# Patient Record
Sex: Female | Born: 1990 | Race: Black or African American | Hispanic: No | Marital: Single | Smoking: Never smoker
Health system: Southern US, Community
[De-identification: ages and names within clinical notes are randomized; demographics above are authoritative.]

## PROBLEM LIST (undated history)

## (undated) DIAGNOSIS — N760 Acute vaginitis: Secondary | ICD-10-CM

## (undated) DIAGNOSIS — B9689 Other specified bacterial agents as the cause of diseases classified elsewhere: Secondary | ICD-10-CM

---

## 2009-09-28 ENCOUNTER — Ambulatory Visit (HOSPITAL_COMMUNITY): Admission: RE | Admit: 2009-09-28 | Discharge: 2009-09-28 | Payer: Self-pay | Admitting: Obstetrics

## 2010-01-01 ENCOUNTER — Emergency Department (HOSPITAL_COMMUNITY): Admission: EM | Admit: 2010-01-01 | Discharge: 2010-01-02 | Payer: Self-pay | Admitting: Emergency Medicine

## 2010-02-16 ENCOUNTER — Encounter: Payer: Self-pay | Admitting: Obstetrics

## 2010-02-16 ENCOUNTER — Inpatient Hospital Stay (HOSPITAL_COMMUNITY): Admission: AD | Admit: 2010-02-16 | Discharge: 2010-02-19 | Payer: Self-pay | Admitting: Obstetrics

## 2010-06-20 LAB — CBC
HCT: 25.1 % — ABNORMAL LOW (ref 36.0–46.0)
Hemoglobin: 7.5 g/dL — ABNORMAL LOW (ref 12.0–15.0)
Hemoglobin: 8 g/dL — ABNORMAL LOW (ref 12.0–15.0)
MCH: 18.1 pg — ABNORMAL LOW (ref 26.0–34.0)
MCHC: 29.8 g/dL — ABNORMAL LOW (ref 30.0–36.0)
MCV: 60.3 fL — ABNORMAL LOW (ref 78.0–100.0)
Platelets: 235 10*3/uL (ref 150–400)
RBC: 4.43 MIL/uL (ref 3.87–5.11)
RDW: 21.1 % — ABNORMAL HIGH (ref 11.5–15.5)
WBC: 18.2 10*3/uL — ABNORMAL HIGH (ref 4.0–10.5)

## 2010-06-20 LAB — RPR: RPR Ser Ql: NONREACTIVE

## 2015-06-03 ENCOUNTER — Telehealth: Payer: Self-pay

## 2015-06-03 NOTE — Telephone Encounter (Signed)
Will check in [facility] system for medical records

## 2015-06-07 ENCOUNTER — Telehealth: Payer: Self-pay

## 2015-06-07 NOTE — Telephone Encounter (Signed)
Faxed medical records from [facility] to [facility] in [place] to [phone]

## 2020-10-30 ENCOUNTER — Emergency Department (HOSPITAL_BASED_OUTPATIENT_CLINIC_OR_DEPARTMENT_OTHER)
Admission: EM | Admit: 2020-10-30 | Discharge: 2020-10-30 | Disposition: A | Discharge status: Home / Self Care | Payer: Medicaid Other | Attending: Emergency Medicine | Admitting: Emergency Medicine

## 2020-10-30 ENCOUNTER — Encounter (HOSPITAL_BASED_OUTPATIENT_CLINIC_OR_DEPARTMENT_OTHER): Payer: Self-pay | Admitting: *Deleted

## 2020-10-30 ENCOUNTER — Other Ambulatory Visit: Payer: Self-pay

## 2020-10-30 DIAGNOSIS — J069 Acute upper respiratory infection, unspecified: Secondary | ICD-10-CM

## 2020-10-30 DIAGNOSIS — U071 COVID-19: Secondary | ICD-10-CM | POA: Insufficient documentation

## 2020-10-30 LAB — RESP PANEL BY RT-PCR (FLU A&B, COVID) ARPGX2
Influenza A by PCR: NEGATIVE
Influenza B by PCR: NEGATIVE
SARS Coronavirus 2 by RT PCR: POSITIVE — AB

## 2020-10-30 NOTE — ED Triage Notes (Signed)
States Friday PM had a fever, states took OTC meds, fever did go down. Does work at a preschool. Did rec both Covid Vaccines but no booster

## 2020-10-30 NOTE — Discharge Instructions (Signed)
You have been tested for COVID-19 today as well as a flu.  You may follow your test results in the MyChart app.  Use over-the-counter medication as needed.if your test negative for COVID-19, you may return to work once you are fever free for 24 hours without Tylenol and ibuprofen.  If you test positive you may not return to work until Thursday 7/28.  Return to the ER for develop any new chest pain, difficulty breathing, nausea or vomiting does not stop, or any new severe symptoms.

## 2020-10-30 NOTE — ED Provider Notes (Signed)
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  T-max 102 F on Saturday.  No fever today, fever managed with OTC medications.  Vaccinated x2 against COVID, has not been boosted.  Wants testing for work as she works around children.  I personally reviewed the patient's medical records.  She does not carry medical diagnoses and is not any medications every day.  HPI     History reviewed. No pertinent past medical history.  There are no problems to display for this patient.   History reviewed. No pertinent surgical history.   OB History   No obstetric history on file.     History reviewed. No pertinent family history.     Home Medications Prior to Admission medications   Not on File    Allergies    Patient has no known allergies.  Review of Systems   Review of Systems  Constitutional:  Positive for chills, fatigue and fever. Negative for activity change, appetite change and diaphoresis.  HENT:  Positive for congestion and sore throat. Negative for tinnitus, trouble swallowing and voice change.   Eyes: Negative.   Respiratory:  Positive for cough. Negative for apnea, choking, chest tightness and shortness of breath.   Cardiovascular: Negative.   Gastrointestinal: Negative.   Genitourinary: Negative.   Musculoskeletal: Negative.   Neurological: Negative.    Physical Exam Updated Vital Signs BP 112/80 (BP Location: Right Arm)   Pulse 98   Temp 98.4 F (36.9 C) (Oral)   Resp 16   Ht 5\' 6"  (1.676 m)   Wt 77.1 kg   BMI 27.44 kg/m   Physical Exam Vitals and nursing note reviewed.  Constitutional:      Appearance: She is normal weight. She is not ill-appearing or  toxic-appearing.  HENT:     Head: Normocephalic and atraumatic.     Nose: Congestion present.     Right Nostril: No septal hematoma.     Left Nostril: No septal hematoma.     Mouth/Throat:     Mouth: Mucous membranes are moist.     Pharynx: Oropharynx is clear. Uvula midline. Posterior oropharyngeal erythema present. No pharyngeal swelling, oropharyngeal exudate or uvula swelling.     Tonsils: No tonsillar exudate.  Eyes:     General: Lids are normal. Vision grossly intact.        Right eye: No discharge.        Left eye: No discharge.     Extraocular Movements: Extraocular movements intact.     Conjunctiva/sclera: Conjunctivae normal.     Pupils: Pupils are equal, round, and reactive to light.     Comments: Colored contacts in place  Neck:     Trachea: Trachea and phonation normal.     Meningeal: Brudzinski's sign and Kernig's sign absent.  Cardiovascular:     Rate and Rhythm: Normal rate and regular rhythm.     Pulses: Normal pulses.     Heart sounds: Normal heart sounds. No murmur heard. Pulmonary:     Effort: Pulmonary effort is normal. No tachypnea, bradypnea, accessory muscle usage, prolonged expiration or respiratory distress.     Breath sounds: Normal breath sounds. No decreased breath sounds, wheezing  or rales.  Chest:     Chest wall: No mass, lacerations, deformity, swelling, tenderness, crepitus or edema.  Abdominal:     General: Bowel sounds are normal. There is no distension.     Palpations: Abdomen is soft.     Tenderness: There is no abdominal tenderness. There is no right CVA tenderness, left CVA tenderness, guarding or rebound.  Musculoskeletal:        General: No deformity.     Cervical back: Normal range of motion and neck supple. No edema, rigidity or crepitus. No pain with movement, spinous process tenderness or muscular tenderness.     Right lower leg: No edema.     Left lower leg: No edema.  Lymphadenopathy:     Cervical: No cervical adenopathy.   Skin:    General: Skin is warm and dry.     Capillary Refill: Capillary refill takes less than 2 seconds.     Findings: No rash.  Neurological:     General: No focal deficit present.     Mental Status: She is alert and oriented to person, place, and time. Mental status is at baseline.  Psychiatric:        Mood and Affect: Mood normal.    ED Results / Procedures / Treatments   Labs (all labs ordered are listed, but only abnormal results are displayed) Labs Reviewed  RESP PANEL BY RT-PCR (FLU A&B, COVID) [id number]    EKG None  Radiology No results found.  Procedures Procedures   Medications Ordered in ED Medications - No data to display  ED Course  I have reviewed the triage vital signs and the nursing notes.  Pertinent labs & imaging results that were available during my care of the patient were reviewed by me and considered in my medical decision making (see chart for details).    MDM Rules/Calculators/A&P                         30 year old female with 3 days of fever, sore throat, chills, nonproductive cough.  Vaccinated historians COVID, works in a daycare.  Differential diagnosis includes is limited to COVID-19, nausea/B, other acute viral upper respiratory infection, pneumonia..  Vital signs are normal intake.  Cardiopulmonary exam is normal, abdominal exam is benign.  HEENT exam did some bilateral nasal congestion without rhinorrhea, mild posterior pharyngeal erythema without exudate or tonsillar edema.  No lymphadenopathy.   Patient is well-appearing.  Will test for COVID-19 and influenza A/B and allow her to be discharged home with outpatient follow-up of her test results on MyChart app.  No further work-up warranted in ED this time given reassuring physical exam and vital signs.  Kathy Schwartz voiced understanding of her medical evaluation and treatment plan.  Each of her questions was answered to her expressed satisfaction.  Return precautions given.  Patient is  well-appearing, stable, appropriate for discharge at this time.  This chart was dictated using voice recognition software, Dragon. Despite the best efforts of this provider to proofread and correct errors, errors may still occur which can change documentation meaning.  LYNDSAY TALAMANTE was evaluated in Emergency Department on 10/30/2020 for the symptoms described in the history of present illness. She was evaluated in the context of the global COVID-19 pandemic, which necessitated consideration that the patient might be at risk for infection with the SARS-CoV-2 virus that causes COVID-19. Institutional protocols and algorithms that pertain to the evaluation of patients at risk for COVID-19 are in a  state of rapid change based on information released by regulatory bodies including the CDC and federal and state organizations. These policies and algorithms were followed during the patient's care in the ED.  Final Clinical Impression(s) / ED Diagnoses Final diagnoses:  None    Rx / DC Orders ED Discharge Orders     None        Sherrilee Gilles 10/30/20 1106    Terrilee Files, MD 10/31/20 1149

## 2021-02-27 ENCOUNTER — Encounter (HOSPITAL_BASED_OUTPATIENT_CLINIC_OR_DEPARTMENT_OTHER): Payer: Self-pay

## 2021-02-27 ENCOUNTER — Other Ambulatory Visit: Payer: Self-pay

## 2021-02-27 ENCOUNTER — Emergency Department (HOSPITAL_BASED_OUTPATIENT_CLINIC_OR_DEPARTMENT_OTHER)
Admission: EM | Admit: 2021-02-27 | Discharge: 2021-02-27 | Disposition: A | Discharge status: Home / Self Care | Payer: Medicaid Other | Attending: Student | Admitting: Student

## 2021-02-27 DIAGNOSIS — N764 Abscess of vulva: Secondary | ICD-10-CM | POA: Insufficient documentation

## 2021-02-27 LAB — PREGNANCY, URINE: Preg Test, Ur: NEGATIVE

## 2021-02-27 MED ORDER — LIDOCAINE HCL (PF) 1 % IJ SOLN
5.0000 mL | Freq: Once | INTRAMUSCULAR | Status: AC
  Administered 2021-02-27: 5 mL via INTRADERMAL
  Filled 2021-02-27: qty 5

## 2021-02-27 MED ORDER — CEFADROXIL 500 MG PO CAPS: 500.0000 mg | ORAL_CAPSULE | Freq: Two times a day (BID) | ORAL | 0 refills | Status: AC

## 2021-02-27 MED ORDER — CEPHALEXIN 250 MG PO CAPS
500.0000 mg | ORAL_CAPSULE | Freq: Once | ORAL | Status: AC
  Administered 2021-02-27: 500 mg via ORAL
  Filled 2021-02-27: qty 2

## 2021-02-27 NOTE — ED Provider Notes (Signed)
[facility] EMERGENCY DEPARTMENT Provider Note   CSN: [id number] Arrival date & time: 02/27/21  1455     History Chief Complaint  Patient presents with   Vaginal Pain    Kathy Schwartz is a 30 y.o. female who presents emergency department for evaluation of vaginal pain and swelling.  Patient states that since she was 30 years old she has had a small swelling in the labia majora on the right but it has not grown in size or been painful for over 20 years.  She states that over the last 1 week this has grown significantly in size and is associated with persistent pain.  She denies vaginal discharge or bleeding.  Denies nausea, vomiting, headache, cough, fever or other systemic symptoms.   Vaginal Pain Pertinent negatives include no chest pain, no abdominal pain and no shortness of breath.      History reviewed. No pertinent past medical history.  There are no problems to display for this patient.   Past Surgical History:  Procedure Laterality Date   CESAREAN SECTION       OB History   No obstetric history on file.     History reviewed. No pertinent family history.  Social History   Tobacco Use   Smoking status: Never   Smokeless tobacco: Never  Vaping Use   Vaping Use: Never used  Substance Use Topics   Alcohol use: Never   Drug use: Never    Home Medications Prior to Admission medications   Not on File    Allergies    Patient has no known allergies.  Review of Systems   Review of Systems  Constitutional:  Negative for chills and fever.  HENT:  Negative for ear pain and sore throat.   Eyes:  Negative for pain and visual disturbance.  Respiratory:  Negative for cough and shortness of breath.   Cardiovascular:  Negative for chest pain and palpitations.  Gastrointestinal:  Negative for abdominal pain and vomiting.  Genitourinary:  Positive for vaginal pain. Negative for dysuria and hematuria.  Musculoskeletal:  Negative for arthralgias and  back pain.  Skin:  Negative for color change and rash.  Neurological:  Negative for seizures and syncope.  All other systems reviewed and are negative.  Physical Exam Updated Vital Signs BP 112/73 (BP Location: Left Arm)   Pulse 76   Temp 98.8 F (37.1 C) (Oral)   Resp 18   Ht 5\' 6"  (1.676 m)   Wt 74.8 kg   LMP 02/07/2021 (Approximate)   SpO2 99%   BMI 26.63 kg/m   Physical Exam Vitals and nursing note reviewed.  Constitutional:      General: She is not in acute distress.    Appearance: She is well-developed.  HENT:     Head: Normocephalic and atraumatic.  Eyes:     Conjunctiva/sclera: Conjunctivae normal.  Cardiovascular:     Rate and Rhythm: Normal rate and regular rhythm.     Heart sounds: No murmur heard. Pulmonary:     Effort: Pulmonary effort is normal. No respiratory distress.     Breath sounds: Normal breath sounds.  Abdominal:     Palpations: Abdomen is soft.     Tenderness: There is no abdominal tenderness.  Genitourinary:    Comments: To centimeter area of swelling and induration over the right labia majora Musculoskeletal:        General: No swelling.     Cervical back: Neck supple.  Skin:    General: Skin  is warm and dry.     Capillary Refill: Capillary refill takes less than 2 seconds.  Neurological:     Mental Status: She is alert.  Psychiatric:        Mood and Affect: Mood normal.    ED Results / Procedures / Treatments   Labs (all labs ordered are listed, but only abnormal results are displayed) Labs Reviewed  PREGNANCY, URINE    EKG None  Radiology No results found.  Procedures .Marland KitchenIncision and Drainage  Date/Time: 02/27/2021 11:03 PM Performed by: Glendora Score, MD Authorized by: Glendora Score, MD   Location:    Type:  Abscess   Size:  2   Location: R labia majora. Pre-procedure details:    Skin preparation:  Povidone-iodine Sedation:    Sedation type:  None Anesthesia:    Anesthesia method:  Local infiltration    Local anesthetic:  Lidocaine 1% w/o epi Procedure type:    Complexity:  Simple Procedure details:    Incision types:  Stab incision   Wound management:  Probed and deloculated   Drainage:  Purulent and bloody   Drainage amount:  Moderate   Wound treatment:  Wound left open Post-procedure details:    Procedure completion:  Tolerated well, no immediate complications   Medications Ordered in ED Medications  lidocaine (PF) (XYLOCAINE) 1 % injection 5 mL (has no administration in time range)    ED Course  I have reviewed the triage vital signs and the nursing notes.  Pertinent labs & imaging results that were available during my care of the patient were reviewed by me and considered in my medical decision making (see chart for details).    MDM Rules/Calculators/A&P                           Patient seen emergency department for evaluation of an abscess.  Physical exam reveals a 2 cm area of tender induration over the right labia majora.  There is no evidence of Bartholin cyst.  I&D performed at bedside with drainage of moderate purulent and bloody fluid.  Wound left open and dressed.  She was then discharged on [facility] with OB/GYN follow-up. Final Clinical Impression(s) / ED Diagnoses Final diagnoses:  None    Rx / DC Orders ED Discharge Orders     None        Dheeraj Hail, Wyn Forster, MD 02/27/21 [phone]

## 2021-02-27 NOTE — ED Triage Notes (Signed)
Pt states she has a "knot on her vagina". States has had it since she was 8, but has been increasing in size and painful the past week. Denies discharge.

## 2021-02-27 NOTE — ED Notes (Signed)
D/c paperwork reviewed with pt, including prescription. Pt verbalized understanding, ambulatory to ED exit.

## 2023-04-02 ENCOUNTER — Emergency Department (HOSPITAL_BASED_OUTPATIENT_CLINIC_OR_DEPARTMENT_OTHER)
Admission: EM | Admit: 2023-04-02 | Discharge: 2023-04-02 | Disposition: A | Discharge status: Home / Self Care | Payer: Self-pay | Attending: Emergency Medicine | Admitting: Emergency Medicine

## 2023-04-02 ENCOUNTER — Encounter (HOSPITAL_BASED_OUTPATIENT_CLINIC_OR_DEPARTMENT_OTHER): Payer: Self-pay | Admitting: Emergency Medicine

## 2023-04-02 ENCOUNTER — Other Ambulatory Visit: Payer: Self-pay

## 2023-04-02 DIAGNOSIS — N898 Other specified noninflammatory disorders of vagina: Secondary | ICD-10-CM | POA: Insufficient documentation

## 2023-04-02 LAB — WET PREP, GENITAL
Clue Cells Wet Prep HPF POC: NONE SEEN
Clue Cells Wet Prep HPF POC: NONE SEEN
Sperm: NONE SEEN
Sperm: NONE SEEN
Trich, Wet Prep: NONE SEEN
Trich, Wet Prep: NONE SEEN
WBC, Wet Prep HPF POC: <10 (ref <10)
WBC, Wet Prep HPF POC: <10 (ref <10)
Yeast Wet Prep HPF POC: NONE SEEN
Yeast Wet Prep HPF POC: NONE SEEN

## 2023-04-02 LAB — URINALYSIS, ROUTINE W REFLEX MICROSCOPIC
Bilirubin Urine: NEGATIVE
Glucose, UA: NEGATIVE mg/dL
Hgb urine dipstick: NEGATIVE
Ketones, ur: NEGATIVE mg/dL
Leukocytes,Ua: NEGATIVE
Nitrite: NEGATIVE
Protein, ur: NEGATIVE mg/dL
Specific Gravity, Urine: 1.02 (ref 1.005–1.030)
pH: 7 (ref 5.0–8.0)

## 2023-04-02 LAB — PREGNANCY, URINE: Preg Test, Ur: NEGATIVE

## 2023-04-02 MED ORDER — CLOTRIMAZOLE 1 % VA CREA: 1.0000 | TOPICAL_CREAM | Freq: Every day | VAGINAL | 0 refills | Status: AC

## 2023-04-02 NOTE — Discharge Instructions (Signed)
Today you are seen for vaginal irritation.  Please pick up your medication and take as prescribed.  You have 1 or more laboratory test pending and will receive a call if these tests require treatment.  Thank you for letting us treat you today. After performing a physical exam and reviewing your labs, I feel you are safe to go home. Please follow up with your PCP in the next several days and provide them with your records from this visit. Return to the Emergency Room if pain becomes severe or symptoms worsen.

## 2023-04-02 NOTE — ED Triage Notes (Signed)
Pt reports "heat" in vaginal area, pt reports being seen recently and taking Diflucan on Thursday, still feeling irritated, denies discharge, painful urination, or itchiness

## 2023-04-02 NOTE — ED Provider Notes (Signed)
[facility]ERGENCY DEPARTMENT AT [facility] Provider Note   CSN: [id number] Arrival date & time: 04/02/23  1951     History  Chief Complaint  Patient presents with   Vaginitis    Kathy Schwartz is a 32 y.o. female presents today for vaginal irritation that she describes as "heat".  Patient describes a history of frequent yeast infections.  She recently took a Diflucan on day 0, day 3, and day 7 which made the discharge she was experiencing stop but she is still having this "heat".  She denies discharge, itching, dysuria, hematuria, fever, pelvic pain, nausea, or vomiting.  Patient states that her last menstrual period began on 11/23.  Patient states that she does have unprotected intercourse therefore STI and pregnancy cannot be ruled out.  HPI     Home Medications Prior to Admission medications   Medication Sig Start Date End Date Taking? Authorizing Provider  clotrimazole (GYNE-LOTRIMIN) 1 % vaginal cream Place 1 Applicatorful vaginally at bedtime for 7 days. 04/02/23 04/09/23 Yes Dolphus Jenny, PA-C      Allergies    Patient has no known allergies.    Review of Systems   Review of Systems  Genitourinary:  Positive for vaginal pain.    Physical Exam Updated Vital Signs BP 118/89 (BP Location: Left Arm)   Pulse 83   Temp 97.8 F (36.6 C)   Resp 18   Ht 5\' 6"  (1.676 m)   Wt 79.4 kg   LMP 03/11/2023 (Approximate)   SpO2 96%   BMI 28.25 kg/m  Physical Exam Vitals and nursing note reviewed.  Constitutional:      General: She is not in acute distress.    Appearance: She is well-developed.  HENT:     Head: Normocephalic and atraumatic.     Right Ear: External ear normal.     Left Ear: External ear normal.     Nose: Nose normal.     Mouth/Throat:     Mouth: Mucous membranes are moist.  Eyes:     Conjunctiva/sclera: Conjunctivae normal.  Cardiovascular:     Rate and Rhythm: Normal rate and regular rhythm.     Pulses: Normal pulses.      Heart sounds: Normal heart sounds. No murmur heard. Pulmonary:     Effort: Pulmonary effort is normal. No respiratory distress.     Breath sounds: Normal breath sounds.  Abdominal:     Palpations: Abdomen is soft.     Tenderness: There is no abdominal tenderness.  Genitourinary:    Cervix: Discharge present. No friability, erythema or cervical bleeding.     Comments: Thick, chunky discharge noted most consistent with vaginal yeast Musculoskeletal:        General: No swelling. Normal range of motion.     Cervical back: Neck supple.  Skin:    General: Skin is warm and dry.     Capillary Refill: Capillary refill takes less than 2 seconds.  Neurological:     General: No focal deficit present.     Mental Status: She is alert.     Motor: No weakness.  Psychiatric:        Mood and Affect: Mood normal.     ED Results / Procedures / Treatments   Labs (all labs ordered are listed, but only abnormal results are displayed) Labs Reviewed  WET PREP, GENITAL  WET PREP, GENITAL  URINALYSIS, ROUTINE W REFLEX MICROSCOPIC  PREGNANCY, URINE  GC/CHLAMYDIA PROBE AMP ([place]) NOT AT [facility]  EKG None  Radiology No results found.  Procedures Procedures    Medications Ordered in ED Medications - No data to display  ED Course/ Medical Decision Making/ A&P                                 Medical Decision Making Amount and/or Complexity of Data Reviewed Labs: ordered.   This patient presents to the ED with chief complaint(s) of vaginal irritation with pertinent past medical history of none which further complicates the presenting complaint. The complaint involves an extensive differential diagnosis and also carries with it a high risk of complications and morbidity.    The differential diagnosis includes BV, yeast infection, gonorrhea, chlamydia, UTI  Additional history obtained: Records reviewed Care Everywhere/External Records  ED Course and Reassessment:   Independent  labs interpretation:  The following labs were independently interpreted:  Urine pregnancy: Negative UA: No notable findings Wet prep: Negative GC chlamydia: Pending  Consultation: - Consulted or discussed management/test interpretation w/ external professional: None  Consideration for admission or further workup: Considered for admission or further workup however patient's vital signs, physical exam, and labs are reassuring.  Patient's pelvic exam was most consistent with vaginal yeast infection and will give patient outpatient course of clotrimazole intravaginally for 1 week.  Patient should follow-up with PCP if symptoms persist for further evaluation and workup.         Final Clinical Impression(s) / ED Diagnoses Final diagnoses:  Vaginal irritation    Rx / DC Orders ED Discharge Orders          Ordered    clotrimazole (GYNE-LOTRIMIN) 1 % vaginal cream  Daily at bedtime        04/02/23 2143              Dolphus Jenny, PA-C 04/02/23 2145    Charlynne Pander, MD 04/02/23 2235

## 2023-04-04 LAB — GC/CHLAMYDIA PROBE AMP (~~LOC~~) NOT AT ARMC
Chlamydia: NEGATIVE
Comment: NEGATIVE
Comment: NORMAL
Neisseria Gonorrhea: NEGATIVE

## 2023-06-20 ENCOUNTER — Encounter (HOSPITAL_BASED_OUTPATIENT_CLINIC_OR_DEPARTMENT_OTHER): Payer: Self-pay | Admitting: Emergency Medicine

## 2023-06-20 ENCOUNTER — Emergency Department (HOSPITAL_BASED_OUTPATIENT_CLINIC_OR_DEPARTMENT_OTHER)
Admission: EM | Admit: 2023-06-20 | Discharge: 2023-06-20 | Disposition: A | Discharge status: Home / Self Care | Attending: Emergency Medicine | Admitting: Emergency Medicine

## 2023-06-20 ENCOUNTER — Other Ambulatory Visit: Payer: Self-pay

## 2023-06-20 DIAGNOSIS — A599 Trichomoniasis, unspecified: Secondary | ICD-10-CM | POA: Diagnosis not present

## 2023-06-20 DIAGNOSIS — Z202 Contact with and (suspected) exposure to infections with a predominantly sexual mode of transmission: Secondary | ICD-10-CM | POA: Diagnosis not present

## 2023-06-20 DIAGNOSIS — N912 Amenorrhea, unspecified: Secondary | ICD-10-CM | POA: Insufficient documentation

## 2023-06-20 DIAGNOSIS — R531 Weakness: Secondary | ICD-10-CM | POA: Diagnosis present

## 2023-06-20 HISTORY — DX: Other specified bacterial agents as the cause of diseases classified elsewhere: N76.0

## 2023-06-20 HISTORY — DX: Other specified bacterial agents as the cause of diseases classified elsewhere: B96.89

## 2023-06-20 LAB — HCG, QUANTITATIVE, PREGNANCY: hCG, Beta Chain, Quant, S: <1 m[IU]/mL (ref <5)

## 2023-06-20 LAB — URINALYSIS, ROUTINE W REFLEX MICROSCOPIC
Bilirubin Urine: NEGATIVE
Glucose, UA: NEGATIVE mg/dL
Hgb urine dipstick: NEGATIVE
Ketones, ur: NEGATIVE mg/dL
Nitrite: NEGATIVE
Protein, ur: NEGATIVE mg/dL
Specific Gravity, Urine: >=1.03 (ref 1.005–1.030)
pH: 6 (ref 5.0–8.0)

## 2023-06-20 LAB — URINALYSIS, MICROSCOPIC (REFLEX)

## 2023-06-20 LAB — PREGNANCY, URINE: Preg Test, Ur: NEGATIVE

## 2023-06-20 LAB — WET PREP, GENITAL
Clue Cells Wet Prep HPF POC: NONE SEEN
Sperm: NONE SEEN
WBC, Wet Prep HPF POC: >=10 — AB (ref <10)
Yeast Wet Prep HPF POC: NONE SEEN

## 2023-06-20 MED ORDER — METRONIDAZOLE 500 MG PO TABS: 500.0000 mg | ORAL_TABLET | Freq: Two times a day (BID) | ORAL | 0 refills | Status: AC

## 2023-06-20 MED ORDER — LIDOCAINE HCL (PF) 1 % IJ SOLN: 1.0000 mL | Freq: Once | INTRAMUSCULAR | Status: AC

## 2023-06-20 MED ORDER — LIDOCAINE HCL (PF) 1 % IJ SOLN
INTRAMUSCULAR | Status: AC
  Administered 2023-06-20: 1 mL
  Filled 2023-06-20: qty 5

## 2023-06-20 MED ORDER — DOXYCYCLINE HYCLATE 100 MG PO CAPS: 100.0000 mg | ORAL_CAPSULE | Freq: Two times a day (BID) | ORAL | 0 refills | Status: AC

## 2023-06-20 MED ORDER — DOXYCYCLINE HYCLATE 100 MG PO TABS
100.0000 mg | ORAL_TABLET | Freq: Once | ORAL | Status: AC
  Administered 2023-06-20: 100 mg via ORAL
  Filled 2023-06-20: qty 1

## 2023-06-20 MED ORDER — METRONIDAZOLE 500 MG PO TABS
500.0000 mg | ORAL_TABLET | Freq: Once | ORAL | Status: AC
  Administered 2023-06-20: 500 mg via ORAL
  Filled 2023-06-20: qty 1

## 2023-06-20 MED ORDER — CEFTRIAXONE SODIUM 500 MG IJ SOLR
500.0000 mg | Freq: Once | INTRAMUSCULAR | Status: AC
  Administered 2023-06-20: 500 mg via INTRAMUSCULAR
  Filled 2023-06-20: qty 500

## 2023-06-20 NOTE — ED Provider Notes (Signed)
 [place] [facility] Provider Note   CSN: [id number] Arrival date & time: 06/20/23  1629     History  Chief Complaint  Patient presents with   Vaginal Discharge    Kathy Schwartz is a 33 y.o. female with no pertinent past medical history presents to emergency department for evaluation of vaginal discharge and amenorrhea.  She endorses that her last menstrual period was 04/07/2023 and normal for her.  However, she has not had a period since then.  She is taken multiple home pregnancy test that were negative.  She is not currently on any types of birth control.  She also endorses white thick discharge that started last week.  She has had unprotected intercourse with other individuals and has concern for STD.  She denies abdominal pain, pelvic pain, vaginal odor, urinary symptoms, pain with intercourse, fevers.  She has an appointment with her OB/GYN on 07/12/2023   Vaginal Discharge Associated symptoms: no abdominal pain, no fever, no nausea and no vomiting       Home Medications Prior to Admission medications   Medication Sig Start Date End Date Taking? Authorizing Provider  doxycycline (VIBRAMYCIN) 100 MG capsule Take 1 capsule (100 mg total) by mouth 2 (two) times daily. 06/20/23  Yes Judithann Sheen, PA  metroNIDAZOLE (FLAGYL) 500 MG tablet Take 1 tablet (500 mg total) by mouth 2 (two) times daily. 06/20/23  Yes Judithann Sheen, PA      Allergies    Patient has no known allergies.    Review of Systems   Review of Systems  Constitutional:  Negative for chills, fatigue and fever.  Respiratory:  Negative for cough, chest tightness, shortness of breath and wheezing.   Cardiovascular:  Negative for chest pain and palpitations.  Gastrointestinal:  Negative for abdominal pain, constipation, diarrhea, nausea and vomiting.  Genitourinary:  Positive for vaginal discharge.  Neurological:  Negative for dizziness, seizures, weakness,  light-headedness, numbness and headaches.    Physical Exam Updated Vital Signs BP 123/84 (BP Location: Left Arm)   Pulse 78   Temp 98.4 F (36.9 C) (Oral)   Resp 14   Ht 5\' 6"  (1.676 m)   Wt 74.8 kg   LMP 04/07/2023 (Exact Date)   SpO2 99%   BMI 26.63 kg/m  Physical Exam Vitals and nursing note reviewed.  Constitutional:      General: She is not in acute distress.    Appearance: Normal appearance. She is not ill-appearing.  HENT:     Head: Normocephalic and atraumatic.  Eyes:     Conjunctiva/sclera: Conjunctivae normal.  Cardiovascular:     Rate and Rhythm: Normal rate.  Pulmonary:     Effort: Pulmonary effort is normal. No respiratory distress.     Breath sounds: Normal breath sounds.  Abdominal:     General: Bowel sounds are normal. There is no distension.     Palpations: Abdomen is soft.     Tenderness: There is no abdominal tenderness. There is no right CVA tenderness, left CVA tenderness, guarding or rebound.     Comments: No peritoneal signs.  Nonsurgical abdomen.  Heeltap negative.  Musculoskeletal:     Cervical back: Normal range of motion and neck supple. No rigidity.  Skin:    General: Skin is warm.     Capillary Refill: Capillary refill takes less than 2 seconds.     Coloration: Skin is not jaundiced or pale.  Neurological:     Mental Status: She is  alert and oriented to person, place, and time. Mental status is at baseline.     ED Results / Procedures / Treatments   Labs (all labs ordered are listed, but only abnormal results are displayed) Labs Reviewed  WET PREP, GENITAL - Abnormal; Notable for the following components:      Result Value   Trich, Wet Prep PRESENT (*)    WBC, Wet Prep HPF POC >=10 (*)    All other components within normal limits  URINALYSIS, ROUTINE W REFLEX MICROSCOPIC - Abnormal; Notable for the following components:   APPearance HAZY (*)    Leukocytes,Ua TRACE (*)    All other components within normal limits  URINALYSIS,  MICROSCOPIC (REFLEX) - Abnormal; Notable for the following components:   Bacteria, UA RARE (*)    Trichomonas, UA PRESENT (*)    All other components within normal limits  HCG, QUANTITATIVE, PREGNANCY  PREGNANCY, URINE  HIV ANTIBODY (ROUTINE TESTING W REFLEX)  GC/CHLAMYDIA PROBE AMP ([place]) NOT AT [facility]    EKG None  Radiology No results found.  Procedures Procedures    Medications Ordered in ED Medications  cefTRIAXone (ROCEPHIN) injection 500 mg (has no administration in time range)  doxycycline (VIBRA-TABS) tablet 100 mg (has no administration in time range)  metroNIDAZOLE (FLAGYL) tablet 500 mg (has no administration in time range)    ED Course/ Medical Decision Making/ A&P                                 Medical Decision Making Amount and/or Complexity of Data Reviewed Labs: ordered.  Risk Prescription drug management.   Patient presents to the ED for concern of amenorrhea, vaginal discharge, this involves an extensive number of treatment options, and is a complaint that carries with it a high risk of complications and morbidity.  The differential diagnosis includes pregnancy, STD, BV, yeast infection   Co morbidities that complicate the patient evaluation  Exposure with nonprotected sexual intercourse   Additional history obtained:  Additional history obtained from Nursing and Outside Medical Records   External records from outside source obtained and reviewed including triage RN note   Lab Tests:  I Ordered, and personally interpreted labs.  The pertinent results include:   GC chlamydia pending Urine contaminated but does not appear to be positive for UTI Trichomonas positive hCG negative    Medicines ordered and prescription drug management:  I ordered medication including Rocephin, Doxy, Metro for STD Reevaluation of the patient after these medicines showed that the patient improved I have reviewed the patients home medicines and have  made adjustments as needed    Problem List / ED Course:  Amenorrhea hCG quantitative less than 1 Denies forms of birth control Low suspicion for PID as patient denies pain with intercourse, no abd pain, VS WNL Shared decision making is had with patient and provider.  Patient does not wish to have pelvic exam at this time as she is following up with her GYN early April and will have them do it. Patient self swabbed. Will have patient follow-up with OB/GYN Exposure to STD Trichomonas infection Will treat for trichomonas Patient is interested in treating for GC and chlamydia she believes she has had an exposure as well as white thick vaginal discharge. GC chlamydia pending.  I discussed that she can follow-up on her MyChart regarding this results Wet prep significant for trichomonas.  No BV nor yeast Discussed importance of  taking entire antibiotic even if starting to feel better   Reevaluation:  After the interventions noted above, I reevaluated the patient and found that they have :stayed the same   Social Determinants of Health:  Is followed by [facility] at [employer]   Dispostion:  After consideration of the diagnostic results and the patients response to treatment, I feel that the patent would benefit from outpatient management with antibiotic course.   Discussed ED workup, disposition, return to ED precautions with patient who expresses understanding agrees with plan.  All questions answered to their satisfaction.  They are agreeable to plan.  Discharge instructions provided on paperwork  Final Clinical Impression(s) / ED Diagnoses Final diagnoses:  Amenorrhea  Trichomonas infection  Exposure to STD    Rx / DC Orders ED Discharge Orders          Ordered    metroNIDAZOLE (FLAGYL) 500 MG tablet  2 times daily        06/20/23 2109    doxycycline (VIBRAMYCIN) 100 MG capsule  2 times daily        06/20/23 2109               Judithann Sheen, PA 06/20/23 2126    Vanetta Mulders, MD 06/22/23 [phone]

## 2023-06-20 NOTE — Discharge Instructions (Signed)
 Thank you for letting us evaluate you today.  We have provided you with antibiotics here in emergency department .I have also provided you with 2 antibiotics at your pharmacy to start taking tomorrow.  Please do not miss a dose and take entire course even if you start to feel better.  Please follow-up with your GYN regarding further management.  You may check your MyChart to check for results that should result within the next 24 to 48 hours  Turn to emergency department if you experience significant worsening of symptoms

## 2023-06-20 NOTE — ED Triage Notes (Signed)
 Pt POV steady gait- reports not having period since 04/07/23.  Reports negative urine pregnancy test at PCP.    Reports cramping and white discharge this week.

## 2023-06-21 LAB — GC/CHLAMYDIA PROBE AMP (~~LOC~~) NOT AT ARMC
Chlamydia: POSITIVE — AB
Comment: NEGATIVE
Comment: NORMAL
Neisseria Gonorrhea: NEGATIVE

## 2023-06-21 LAB — HIV ANTIBODY (ROUTINE TESTING W REFLEX): HIV Screen 4th Generation wRfx: NONREACTIVE

## 2023-08-05 ENCOUNTER — Other Ambulatory Visit: Payer: Self-pay

## 2023-08-05 ENCOUNTER — Emergency Department (HOSPITAL_BASED_OUTPATIENT_CLINIC_OR_DEPARTMENT_OTHER)
Admission: EM | Admit: 2023-08-05 | Discharge: 2023-08-05 | Disposition: A | Discharge status: Home / Self Care | Attending: Emergency Medicine | Admitting: Emergency Medicine

## 2023-08-05 ENCOUNTER — Encounter (HOSPITAL_BASED_OUTPATIENT_CLINIC_OR_DEPARTMENT_OTHER): Payer: Self-pay

## 2023-08-05 DIAGNOSIS — N760 Acute vaginitis: Secondary | ICD-10-CM | POA: Diagnosis not present

## 2023-08-05 DIAGNOSIS — R300 Dysuria: Secondary | ICD-10-CM | POA: Diagnosis present

## 2023-08-05 DIAGNOSIS — B9689 Other specified bacterial agents as the cause of diseases classified elsewhere: Secondary | ICD-10-CM

## 2023-08-05 LAB — URINALYSIS, ROUTINE W REFLEX MICROSCOPIC
Bilirubin Urine: NEGATIVE
Glucose, UA: NEGATIVE mg/dL
Hgb urine dipstick: NEGATIVE
Ketones, ur: NEGATIVE mg/dL
Leukocytes,Ua: NEGATIVE
Nitrite: NEGATIVE
Protein, ur: NEGATIVE mg/dL
Specific Gravity, Urine: 1.025 (ref 1.005–1.030)
pH: 7 (ref 5.0–8.0)

## 2023-08-05 LAB — WET PREP, GENITAL
Sperm: NONE SEEN
Trich, Wet Prep: NONE SEEN
WBC, Wet Prep HPF POC: <10 (ref <10)
Yeast Wet Prep HPF POC: NONE SEEN

## 2023-08-05 MED ORDER — METRONIDAZOLE 0.75 % VA GEL: 1.0000 | Freq: Every day | VAGINAL | 0 refills | Status: AC

## 2023-08-05 NOTE — Discharge Instructions (Addendum)
 Today you were seen for vaginal itching.  You were found to have a bacterial vaginosis, please pick up your medication and use as prescribed.  Please follow-up with your primary care if your symptoms persist for further evaluation workup.  Thank you for letting us  treat you today. After reviewing your labs, I feel you are safe to go home. Please follow up with your PCP in the next several days and provide them with your records from this visit. Return to the Emergency Room if pain becomes severe or symptoms worsen.

## 2023-08-05 NOTE — ED Triage Notes (Signed)
 Pt presents to ED from home C/O dysuria and vaginal itching.

## 2023-08-05 NOTE — ED Provider Notes (Signed)
 [place] [facility] Provider Note   CSN: [id number] Arrival date & time: 08/05/23  1806     History  Chief Complaint  Patient presents with   Dysuria    Kathy Schwartz is a 33 y.o. female presents today for dysuria and vaginal itching.  Patient also reports thick white discharge.  Patient endorses possibility of STD.  Patient denies pelvic pain or cramping.   Dysuria      Home Medications Prior to Admission medications   Medication Sig Start Date End Date Taking? Authorizing Provider  metroNIDAZOLE  (METROGEL ) 0.75 % vaginal gel Place 1 Applicatorful vaginally daily for 5 days. 08/05/23 08/10/23 Yes Letisha Yera N, PA-C  doxycycline  (VIBRAMYCIN ) 100 MG capsule Take 1 capsule (100 mg total) by mouth 2 (two) times daily. 06/20/23   Royann Cords, PA  metroNIDAZOLE  (FLAGYL ) 500 MG tablet Take 1 tablet (500 mg total) by mouth 2 (two) times daily. 06/20/23   Royann Cords, PA      Allergies    Patient has no known allergies.    Review of Systems   Review of Systems  Genitourinary:  Positive for dysuria.       Vaginal itching    Physical Exam Updated Vital Signs BP 113/79 (BP Location: Left Arm)   Pulse 66   Temp 97.8 F (36.6 C) (Temporal)   Resp 16   Ht 5\' 6"  (1.676 m)   Wt 79.4 kg   SpO2 100%   BMI 28.25 kg/m  Physical Exam Vitals and nursing note reviewed.  Constitutional:      General: She is not in acute distress.    Appearance: Normal appearance. She is well-developed. She is not ill-appearing, toxic-appearing or diaphoretic.  HENT:     Head: Normocephalic and atraumatic.     Right Ear: External ear normal.     Left Ear: External ear normal.     Nose: Nose normal.     Mouth/Throat:     Mouth: Mucous membranes are moist.  Eyes:     Conjunctiva/sclera: Conjunctivae normal.  Cardiovascular:     Rate and Rhythm: Normal rate and regular rhythm.     Pulses: Normal pulses.  Pulmonary:     Effort: Pulmonary effort  is normal. No respiratory distress.     Breath sounds: Normal breath sounds.  Abdominal:     Palpations: Abdomen is soft.     Tenderness: There is no abdominal tenderness.  Musculoskeletal:        General: No swelling.     Cervical back: Neck supple.  Skin:    General: Skin is warm and dry.     Capillary Refill: Capillary refill takes less than 2 seconds.  Neurological:     General: No focal deficit present.     Mental Status: She is alert.  Psychiatric:        Mood and Affect: Mood normal.     ED Results / Procedures / Treatments   Labs (all labs ordered are listed, but only abnormal results are displayed) Labs Reviewed  WET PREP, GENITAL - Abnormal; Notable for the following components:      Result Value   Clue Cells Wet Prep HPF POC PRESENT (*)    All other components within normal limits  URINALYSIS, ROUTINE W REFLEX MICROSCOPIC - Abnormal; Notable for the following components:   APPearance HAZY (*)    All other components within normal limits  GC/CHLAMYDIA PROBE AMP ([place]) NOT AT [facility]  EKG None  Radiology No results found.  Procedures Procedures    Medications Ordered in ED Medications - No data to display  ED Course/ Medical Decision Making/ A&P                                 Medical Decision Making Amount and/or Complexity of Data Reviewed Labs: ordered.   This patient presents to the ED for concern of dysuria and vaginal itching differential diagnosis includes UTI, yeast infection, BV, trichomonas, gonorrhea, chlamydia    Additional history obtained: External records from outside source obtained and reviewed including Care Everywhere   Lab Tests:  I Ordered, and personally interpreted labs.  The pertinent results include: Wet prep positive for clue cells  Considered for admission or further workup however patient's vital signs, physical exam, and labs are reassuring.  Patient's symptoms likely due to bacterial vaginosis.  Patient  prescribed outpatient course of metronidazole .  Patient has pending GC chlamydia and will be notified if these results warrant treatment.  I feel patient safe for discharge at this time.           Final Clinical Impression(s) / ED Diagnoses Final diagnoses:  Bacterial vaginosis    Rx / DC Orders ED Discharge Orders          Ordered    metroNIDAZOLE  (METROGEL ) 0.75 % vaginal gel  Daily        08/05/23 2113              Carie Charity, PA-C 08/05/23 2132    Mordecai Applebaum, MD 08/06/23 1259

## 2023-08-07 LAB — GC/CHLAMYDIA PROBE AMP (~~LOC~~) NOT AT ARMC
Chlamydia: NEGATIVE
Comment: NEGATIVE
Comment: NORMAL
Neisseria Gonorrhea: NEGATIVE

## 2023-09-11 ENCOUNTER — Other Ambulatory Visit: Payer: Self-pay

## 2023-09-11 ENCOUNTER — Encounter (HOSPITAL_BASED_OUTPATIENT_CLINIC_OR_DEPARTMENT_OTHER): Payer: Self-pay | Admitting: Emergency Medicine

## 2023-09-11 DIAGNOSIS — S30850A Superficial foreign body of lower back and pelvis, initial encounter: Secondary | ICD-10-CM | POA: Insufficient documentation

## 2023-09-11 DIAGNOSIS — W4909XA Other specified item causing external constriction, initial encounter: Secondary | ICD-10-CM | POA: Insufficient documentation

## 2023-09-11 NOTE — ED Triage Notes (Signed)
 Pt reports she has a dermal piercing on her back; she is scheduled for an MRI tomorrow and would like it removed

## 2023-09-12 ENCOUNTER — Emergency Department (HOSPITAL_BASED_OUTPATIENT_CLINIC_OR_DEPARTMENT_OTHER)
Admission: EM | Admit: 2023-09-12 | Discharge: 2023-09-12 | Disposition: A | Discharge status: Home / Self Care | Attending: Emergency Medicine | Admitting: Emergency Medicine

## 2023-09-12 DIAGNOSIS — T148XXA Other injury of unspecified body region, initial encounter: Secondary | ICD-10-CM

## 2023-09-12 NOTE — ED Provider Notes (Signed)
  [place] [facility] Provider Note   CSN: [id number] Arrival date & time: 09/11/23  2224     History  Chief Complaint  Patient presents with   Foreign Body in Skin    Kathy Schwartz is a 33 y.o. female.  The history is provided by the patient.   Patient presented to have a piercing removed from her back She is scheduled to have an MRI of her brain tomorrow and was told to have it removed.  She went to her piercing specialist who was unavailable No other complaints    Home Medications Prior to Admission medications   Medication Sig Start Date End Date Taking? Authorizing Provider  doxycycline  (VIBRAMYCIN ) 100 MG capsule Take 1 capsule (100 mg total) by mouth 2 (two) times daily. 06/20/23   Royann Cords, PA  metroNIDAZOLE  (FLAGYL ) 500 MG tablet Take 1 tablet (500 mg total) by mouth 2 (two) times daily. 06/20/23   Royann Cords, PA      Allergies    Patient has no known allergies.    Review of Systems   Review of Systems  Physical Exam Updated Vital Signs BP 122/84   Pulse 68   Temp (!) 97.3 F (36.3 C)   Resp 16   Ht 1.676 m (5\' 6" )   Wt 79.4 kg   SpO2 100%   BMI 28.25 kg/m  Physical Exam CONSTITUTIONAL: Well developed/well nourished HEAD: Normocephalic/atraumatic NEURO: Pt is awake/alert/appropriate, moves all extremitiesx4.  No facial droop.   SKIN: Piercing noted to her lower back.  No erythema, no fluctuance, no tenderness PSYCH: no abnormalities of mood noted, alert and oriented to situation  ED Results / Procedures / Treatments   Labs (all labs ordered are listed, but only abnormal results are displayed) Labs Reviewed - No data to display  EKG None  Radiology No results found.  Procedures Procedures    Medications Ordered in ED Medications - No data to display  ED Course/ Medical Decision Making/ A&P                                 Medical Decision Making  We are unable to remove the  piercing here in the ER.  She will be discharged to follow-up with her piercing specialist, though this may delay her MRI.  No emergent condition identified        Final Clinical Impression(s) / ED Diagnoses Final diagnoses:  Foreign body in skin    Rx / DC Orders ED Discharge Orders     None         Eldon Greenland, MD 09/12/23 [phone]

## 2023-09-12 NOTE — Discharge Instructions (Signed)
 Call your piercing specialist today
